# Patient Record
Sex: Female | Born: 2000 | Race: White | Hispanic: No | Marital: Single | Smoking: Never smoker
Health system: Southern US, Community
[De-identification: ages and names within clinical notes are randomized; demographics above are authoritative.]

---
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
## 2021-08-01 ENCOUNTER — Other Ambulatory Visit: Payer: Self-pay

## 2021-08-01 ENCOUNTER — Encounter: Payer: Self-pay | Admitting: Emergency Medicine

## 2021-08-01 ENCOUNTER — Ambulatory Visit
Admission: EM | Admit: 2021-08-01 | Discharge: 2021-08-01 | Disposition: A | Discharge status: Home / Self Care | Payer: BLUE CROSS/BLUE SHIELD | Attending: Emergency Medicine | Admitting: Emergency Medicine

## 2021-08-01 ENCOUNTER — Ambulatory Visit (INDEPENDENT_AMBULATORY_CARE_PROVIDER_SITE_OTHER): Payer: Self-pay

## 2021-08-01 DIAGNOSIS — R059 Cough, unspecified: Secondary | ICD-10-CM

## 2021-08-01 DIAGNOSIS — R051 Acute cough: Secondary | ICD-10-CM

## 2021-08-01 DIAGNOSIS — R0981 Nasal congestion: Secondary | ICD-10-CM

## 2021-08-01 DIAGNOSIS — J302 Other seasonal allergic rhinitis: Secondary | ICD-10-CM

## 2021-08-01 NOTE — ED Triage Notes (Signed)
Pt here with a cough and sinus pressure that was treated about a month ago with abx and steroids, but never really went away. States sx got a little better, but became worse again.

## 2021-08-01 NOTE — ED Provider Notes (Signed)
Renaldo Fiddler    CSN: [id number] Arrival date & time: 08/01/21  1355      History   Chief Complaint Chief Complaint  Patient presents with   Cough   Chest Pain   Nasal Congestion   Facial Pain    HPI Diana Duncan is a 20 y.o. female.  Patient presents with sinus congestion, postnasal drip, cough x1 month.  She was seen at [facility] and treated with 14-day course of Augmentin and 3-day course of prednisone.  She states her symptoms improved but worsened again yesterday.  She denies fever, rash, shortness of breath, vomiting, diarrhea, or other symptoms.  No other pertinent medical history.  The history is provided by the patient.   History reviewed. No pertinent past medical history.  There are no problems to display for this patient.   History reviewed. No pertinent surgical history.  OB History   No obstetric history on file.      Home Medications    Prior to Admission medications   Not on File    Family History Family History  Family history unknown: Yes    Social History Social History   Tobacco Use   Smoking status: Never   Smokeless tobacco: Never  Substance Use Topics   Alcohol use: Not Currently   Drug use: Never     Allergies   Patient has no known allergies.   Review of Systems Review of Systems  Constitutional:  Negative for chills and fever.  HENT:  Positive for congestion and postnasal drip. Negative for ear pain and sore throat.   Respiratory:  Positive for cough. Negative for shortness of breath.   Cardiovascular:  Negative for chest pain and palpitations.  Gastrointestinal:  Negative for abdominal pain, diarrhea and vomiting.  Skin:  Negative for color change and rash.  All other systems reviewed and are negative.   Physical Exam Triage Vital Signs ED Triage Vitals  Enc Vitals Group     BP 08/01/21 1432 91/62     Pulse Rate 08/01/21 1432 98     Resp 08/01/21 1432 18     Temp 08/01/21 1432 98.8 F  (37.1 C)     Temp Source 08/01/21 1432 Oral     SpO2 08/01/21 1432 98 %     Weight --      Height --      Head Circumference --      Peak Flow --      Pain Score 08/01/21 1439 3     Pain Loc --      Pain Edu? --      Excl. in GC? --    No data found.  Updated Vital Signs BP 91/62 (BP Location: Left Arm)   Pulse 98   Temp 98.8 F (37.1 C) (Oral)   Resp 18   LMP 08/01/2021 (Approximate)   SpO2 98%   Visual Acuity Right Eye Distance:   Left Eye Distance:   Bilateral Distance:    Right Eye Near:   Left Eye Near:    Bilateral Near:     Physical Exam Vitals and nursing note reviewed.  Constitutional:      General: She is not in acute distress.    Appearance: She is well-developed. She is not ill-appearing.  HENT:     Head: Normocephalic and atraumatic.     Right Ear: Tympanic membrane normal.     Left Ear: Tympanic membrane normal.     Nose: Nose normal.  Mouth/Throat:     Mouth: Mucous membranes are moist.     Pharynx: Oropharynx is clear.  Eyes:     Conjunctiva/sclera: Conjunctivae normal.  Cardiovascular:     Rate and Rhythm: Normal rate and regular rhythm.     Heart sounds: Normal heart sounds.  Pulmonary:     Effort: Pulmonary effort is normal. No respiratory distress.     Breath sounds: Normal breath sounds.  Abdominal:     Palpations: Abdomen is soft.     Tenderness: There is no abdominal tenderness.  Musculoskeletal:     Cervical back: Neck supple.  Skin:    General: Skin is warm and dry.  Neurological:     General: No focal deficit present.     Mental Status: She is alert and oriented to person, place, and time.     Gait: Gait normal.  Psychiatric:        Mood and Affect: Mood normal.        Behavior: Behavior normal.     UC Treatments / Results  Labs (all labs ordered are listed, but only abnormal results are displayed) Labs Reviewed - No data to display  EKG   Radiology DG Chest 2 View  Result Date: 08/01/2021 CLINICAL DATA:   cough x 1 month EXAM: CHEST - 2 VIEW COMPARISON:  None. FINDINGS: The cardiomediastinal silhouette is normal in contour. No pleural effusion. No pneumothorax. No acute pleuroparenchymal abnormality. Visualized abdomen is unremarkable. No acute osseous abnormality noted. IMPRESSION: No acute cardiopulmonary abnormality. Electronically Signed   By: Meda Klinefelter M.D.   On: 08/01/2021 15:05    Procedures Procedures (including critical care time)  Medications Ordered in UC Medications - No data to display  Initial Impression / Assessment and Plan / UC Course  I have reviewed the triage vital signs and the nursing notes.  Pertinent labs & imaging results that were available during my care of the patient were reviewed by me and considered in my medical decision making (see chart for details).   Seasonal allergies, cough, sinus congestion.   Patient is well-appearing and reassuring.  She is afebrile and her vital signs are stable.  She was recently treated with Augmentin and prednisone from [employer].  Chest x-ray negative.  Discussed daily Zyrtec and Flonase nasal spray.  Education provided on allergic rhinitis.  Instructed patient to follow-up with her PCP if her symptoms are not improving.  She agrees to plan of care.   Final Clinical Impressions(s) / UC Diagnoses   Final diagnoses:  Seasonal allergies  Acute cough  Sinus congestion     Discharge Instructions      Take Zyrtec and use Flonase nasal spray daily.  Follow up with your primary care provider if your symptoms are not improving.        ED Prescriptions   None    PDMP not reviewed this encounter.   Mickie Bail, NP 08/01/21 [phone]

## 2021-08-01 NOTE — Discharge Instructions (Addendum)
Your chest x-ray is normal.    Take Zyrtec and use Flonase nasal spray daily.  Follow up with your primary care provider if your symptoms are not improving.
